# Patient Record
Sex: Female | Born: 1985 | Race: Black or African American | Hispanic: No | Marital: Single | Smoking: Former smoker
Health system: Southern US, Community
[De-identification: ages and names within clinical notes are randomized; demographics above are authoritative.]

## PROBLEM LIST (undated history)

## (undated) ENCOUNTER — Ambulatory Visit (HOSPITAL_COMMUNITY): Admission: EM | Payer: Medicaid - Out of State

---
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
## 2006-06-06 ENCOUNTER — Emergency Department (HOSPITAL_COMMUNITY): Admission: EM | Admit: 2006-06-06 | Discharge: 2006-06-07 | Payer: Self-pay | Admitting: Emergency Medicine

## 2010-11-11 ENCOUNTER — Encounter: Payer: Self-pay | Admitting: Obstetrics & Gynecology

## 2017-03-24 ENCOUNTER — Emergency Department (HOSPITAL_COMMUNITY): Payer: Self-pay

## 2017-03-24 ENCOUNTER — Encounter (HOSPITAL_COMMUNITY): Payer: Self-pay | Admitting: Emergency Medicine

## 2017-03-24 ENCOUNTER — Emergency Department (HOSPITAL_COMMUNITY)
Admission: EM | Admit: 2017-03-24 | Discharge: 2017-03-24 | Disposition: A | Discharge status: Home / Self Care | Payer: Self-pay | Attending: Emergency Medicine | Admitting: Emergency Medicine

## 2017-03-24 DIAGNOSIS — Y999 Unspecified external cause status: Secondary | ICD-10-CM | POA: Insufficient documentation

## 2017-03-24 DIAGNOSIS — Y929 Unspecified place or not applicable: Secondary | ICD-10-CM | POA: Insufficient documentation

## 2017-03-24 DIAGNOSIS — F17210 Nicotine dependence, cigarettes, uncomplicated: Secondary | ICD-10-CM | POA: Insufficient documentation

## 2017-03-24 DIAGNOSIS — Y939 Activity, unspecified: Secondary | ICD-10-CM | POA: Insufficient documentation

## 2017-03-24 DIAGNOSIS — S99921A Unspecified injury of right foot, initial encounter: Secondary | ICD-10-CM | POA: Insufficient documentation

## 2017-03-24 MED ORDER — IBUPROFEN 400 MG PO TABS
600.0000 mg | ORAL_TABLET | Freq: Once | ORAL | Status: AC
  Administered 2017-03-24: 600 mg via ORAL
  Filled 2017-03-24: qty 1

## 2017-03-24 MED ORDER — MELOXICAM 7.5 MG PO TABS: 7.5000 mg | ORAL_TABLET | Freq: Every day | ORAL | 0 refills | Status: DC

## 2017-03-24 NOTE — Discharge Instructions (Signed)
Please read instructions below. Apply ice to your ankle for 20 minutes at a time. Elevate it when possible. You can take meloxicam once a day, with food,  as needed for pain. Schedule an appointment with the orthopedic specialist in 1 week for follow-up on your injury. Return to the ER for new or concerning symptoms.

## 2017-03-24 NOTE — ED Notes (Signed)
Patient transported to X-ray 
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
## 2017-03-24 NOTE — Progress Notes (Signed)
Orthopedic Tech Progress Note Patient Details:  Graylon GunningSequoia J Oshiro [birth year] [id number]  Ortho Devices Type of Ortho Device: Ankle Air splint Ortho Device/Splint Interventions: Application   Saul FordyceJennifer C Reesa Gotschall 03/24/2017, 3:01 PM
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
## 2019-11-08 ENCOUNTER — Other Ambulatory Visit: Payer: Self-pay

## 2019-11-08 ENCOUNTER — Emergency Department (HOSPITAL_COMMUNITY): Payer: Medicaid - Out of State

## 2019-11-08 ENCOUNTER — Emergency Department (HOSPITAL_COMMUNITY)
Admission: EM | Admit: 2019-11-08 | Discharge: 2019-11-08 | Disposition: A | Discharge status: Home / Self Care | Payer: Medicaid - Out of State | Attending: Emergency Medicine | Admitting: Emergency Medicine

## 2019-11-08 ENCOUNTER — Encounter (HOSPITAL_COMMUNITY): Payer: Self-pay | Admitting: Emergency Medicine

## 2019-11-08 DIAGNOSIS — R05 Cough: Secondary | ICD-10-CM | POA: Diagnosis not present

## 2019-11-08 DIAGNOSIS — F17210 Nicotine dependence, cigarettes, uncomplicated: Secondary | ICD-10-CM | POA: Diagnosis not present

## 2019-11-08 DIAGNOSIS — M7918 Myalgia, other site: Secondary | ICD-10-CM | POA: Diagnosis not present

## 2019-11-08 DIAGNOSIS — J029 Acute pharyngitis, unspecified: Secondary | ICD-10-CM | POA: Insufficient documentation

## 2019-11-08 DIAGNOSIS — R509 Fever, unspecified: Secondary | ICD-10-CM | POA: Diagnosis present

## 2019-11-08 DIAGNOSIS — U071 COVID-19: Secondary | ICD-10-CM | POA: Diagnosis not present

## 2019-11-08 DIAGNOSIS — Z20822 Contact with and (suspected) exposure to covid-19: Secondary | ICD-10-CM

## 2019-11-08 MED ORDER — IBUPROFEN 400 MG PO TABS
800.0000 mg | ORAL_TABLET | Freq: Once | ORAL | Status: AC
  Administered 2019-11-08: 04:00:00 800 mg via ORAL
  Filled 2019-11-08: qty 2
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
## 2019-11-08 NOTE — ED Notes (Signed)
Portable xray at bedside.
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
## 2019-11-08 NOTE — Discharge Instructions (Addendum)
Your chest Xray in the ED was reassuring.  Your fever is likely due to a viral illness.  You have a Covid test pending which should result within 48 hours.  You can follow-up on the results of this test through MyChart.  We advise 600mg  ibuprofen every 6 hours for headache and body aches. You may alternate this with 1000mg  Tylenol every 8 hours, if desired, for fever management. Drink plenty of fluids to prevent dehydration. Return for any new or concerning symptoms.

## 2019-11-08 NOTE — ED Notes (Signed)
Called 2 more times  No answer

## 2019-11-08 NOTE — ED Notes (Signed)
ED Provider at bedside. 

## 2019-11-08 NOTE — ED Notes (Signed)
Pt did not answer for triage. 

## 2019-11-09 ENCOUNTER — Ambulatory Visit: Payer: Self-pay

## 2019-11-09 NOTE — Telephone Encounter (Signed)
Provided Patient with Covid -19 test results of Patient  Provide care advice .  Reviewed isolation protocol Patient voiced understanding.  Provided care advice.  Voiced understanding. Will notify HD.

## 2019-11-10 ENCOUNTER — Telehealth: Payer: Self-pay

## 2019-11-10 NOTE — Telephone Encounter (Signed)
Phone call to pt.  Inquired if pt. had returned to [place], [place]., or if she was in [place] since she tested positive for COVID, on 11/08/19?  Pt. reported she was quarantining at relative's home in [place].  Advised will make [place] aware, and to expect a call from a nurse with the Health [facility].  Asked pt. If she had any questions re: her quarantine or home care?  Denied any questions at this time.   Will notify [facility].

## 2019-11-11 LAB — NOVEL CORONAVIRUS, NAA (HOSP ORDER, SEND-OUT TO REF LAB; TAT 18-24 HRS): SARS-CoV-2, NAA: DETECTED — AB

## 2019-11-20 ENCOUNTER — Ambulatory Visit: Payer: Medicaid - Out of State | Attending: Internal Medicine

## 2019-11-20 DIAGNOSIS — Z20822 Contact with and (suspected) exposure to covid-19: Secondary | ICD-10-CM

## 2019-11-21 LAB — NOVEL CORONAVIRUS, NAA: SARS-CoV-2, NAA: NOT DETECTED

## 2020-11-03 ENCOUNTER — Other Ambulatory Visit: Payer: Medicaid - Out of State
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
## 2021-09-06 ENCOUNTER — Emergency Department (HOSPITAL_BASED_OUTPATIENT_CLINIC_OR_DEPARTMENT_OTHER)
Admission: EM | Admit: 2021-09-06 | Discharge: 2021-09-06 | Disposition: A | Discharge status: Home / Self Care | Payer: Medicaid Other | Attending: Emergency Medicine | Admitting: Emergency Medicine

## 2021-09-06 ENCOUNTER — Encounter (HOSPITAL_BASED_OUTPATIENT_CLINIC_OR_DEPARTMENT_OTHER): Payer: Self-pay | Admitting: Emergency Medicine

## 2021-09-06 ENCOUNTER — Other Ambulatory Visit: Payer: Self-pay

## 2021-09-06 DIAGNOSIS — R102 Pelvic and perineal pain: Secondary | ICD-10-CM | POA: Diagnosis present

## 2021-09-06 DIAGNOSIS — N907 Vulvar cyst: Secondary | ICD-10-CM | POA: Diagnosis not present

## 2021-09-06 DIAGNOSIS — F17210 Nicotine dependence, cigarettes, uncomplicated: Secondary | ICD-10-CM | POA: Diagnosis not present

## 2021-09-06 NOTE — ED Provider Notes (Signed)
[facility] EMERGENCY DEPT Provider Note   CSN: [id number] Arrival date & time: 09/06/21  7654     History Chief Complaint  Patient presents with   Vaginal Pain    Sara Key is a 35 y.o. female.  34 year old female presents with mass to left inner labia.  Denies any drainage from the area.  States that it was smaller before and now appears to be larger.  No fever or chills.  Just finished her menstrual cycle currently.  Had some early abdominal cramping associate with her cycle but resolved with Tylenol.      History reviewed. No pertinent past medical history.  There are no problems to display for this patient.   History reviewed. No pertinent surgical history.   OB History     Gravida  1   Para      Term      Preterm      AB      Living         SAB      IAB      Ectopic      Multiple      Live Births              History reviewed. No pertinent family history.  Social History   Tobacco Use   Smoking status: Every Day    Packs/day: 0.50    Types: Cigarettes   Smokeless tobacco: Never  Substance Use Topics   Alcohol use: No    Home Medications Prior to Admission medications   Medication Sig Start Date End Date Taking? Authorizing Provider  levonorgestrel (MIRENA, 52 MG,) 20 MCG/DAY IUD by Intrauterine route. 06/20/19  Yes [provider]  meloxicam (MOBIC) 7.5 MG tablet Take 1 tablet (7.5 mg total) by mouth daily. 03/24/17   Robinson, [place] N, PA-C    Allergies    Patient has no known allergies.  Review of Systems   Review of Systems  All other systems reviewed and are negative.  Physical Exam Updated Vital Signs BP 105/64 (BP Location: Right Arm)   Pulse 81   Temp 97.8 F (36.6 C) (Oral)   Resp 16   Ht 1.549 m (5\' 1" )   Wt 100.7 kg   LMP 08/30/2021 (Exact Date)   SpO2 99%   BMI 41.95 kg/m   Physical Exam Vitals and nursing note reviewed. Exam conducted with a chaperone present.   Constitutional:      General: She is not in acute distress.    Appearance: Normal appearance. She is well-developed. She is not toxic-appearing.  HENT:     Head: Normocephalic and atraumatic.  Eyes:     General: Lids are normal.     Conjunctiva/sclera: Conjunctivae normal.     Pupils: Pupils are equal, round, and reactive to light.  Neck:     Thyroid: No thyroid mass.     Trachea: No tracheal deviation.  Cardiovascular:     Rate and Rhythm: Normal rate and regular rhythm.     Heart sounds: Normal heart sounds. No murmur heard.   No gallop.  Pulmonary:     Effort: Pulmonary effort is normal. No respiratory distress.     Breath sounds: Normal breath sounds. No stridor. No decreased breath sounds, wheezing, rhonchi or rales.  Abdominal:     General: There is no distension.     Palpations: Abdomen is soft.     Tenderness: There is no abdominal tenderness. There is no rebound.  Genitourinary:  Labia:        Left: Lesion present.      Comments: Small pea shaped cyst on the labia minora.  No erythema appreciated.  Well-circumscribed. Musculoskeletal:        General: No tenderness. Normal range of motion.     Cervical back: Normal range of motion and neck supple.  Skin:    General: Skin is warm and dry.     Findings: No abrasion or rash.  Neurological:     Mental Status: She is alert and oriented to person, place, and time. Mental status is at baseline.     GCS: GCS eye subscore is 4. GCS verbal subscore is 5. GCS motor subscore is 6.     Cranial Nerves: No cranial nerve deficit.     Sensory: No sensory deficit.     Motor: Motor function is intact.  Psychiatric:        Attention and Perception: Attention normal.        Speech: Speech normal.        Behavior: Behavior normal.    ED Results / Procedures / Treatments   Labs (all labs ordered are listed, but only abnormal results are displayed) Labs Reviewed - No data to display  EKG None  Radiology No results  found.  Procedures Procedures   Medications Ordered in ED Medications - No data to display  ED Course  I have reviewed the triage vital signs and the nursing notes.  Pertinent labs & imaging results that were available during my care of the patient were reviewed by me and considered in my medical decision making (see chart for details).    MDM Rules/Calculators/A&P                          Patient with labial cyst.  Will give referral to GYN Final Clinical Impression(s) / ED Diagnoses Final diagnoses:  None    Rx / DC Orders ED Discharge Orders     None        Lorre Nick, MD 09/06/21 [phone]

## 2021-09-06 NOTE — ED Notes (Signed)
Patient verbalizes understanding of discharge instructions. Opportunity for questioning and answers were provided. Patient discharged from ED.  °

## 2021-09-06 NOTE — ED Triage Notes (Signed)
Pt arrives to ED with c/o of vaginal pain x1 day. She reports cyst to external labia. She reports that she experienced dyspareunia x2 months ago, then experienced her first menstrual cycle in over two years. She has a mirena IUD and does not experience vaginal bleeding. She reports over the past three days she had pelvic pain that is intermittent. LMP x1 week ago.  No vaginal odors/discharge.

## 2021-09-06 NOTE — Discharge Instructions (Signed)
Call the [facility] from [facility] to schedule follow-up care  [phone]

## 2021-09-07 ENCOUNTER — Ambulatory Visit (INDEPENDENT_AMBULATORY_CARE_PROVIDER_SITE_OTHER): Payer: Medicaid Other | Admitting: Obstetrics and Gynecology

## 2021-09-07 ENCOUNTER — Encounter: Payer: Self-pay | Admitting: Obstetrics and Gynecology

## 2021-09-07 DIAGNOSIS — N907 Vulvar cyst: Secondary | ICD-10-CM

## 2021-09-07 MED ORDER — CEPHALEXIN 500 MG PO CAPS: 500.0000 mg | ORAL_CAPSULE | Freq: Three times a day (TID) | ORAL | 0 refills | Status: DC

## 2021-09-07 NOTE — Progress Notes (Signed)
Reports labial cyst. Started very small. Now larger and thinks part of it drained but part is now "hard". Has new sexual partner August. Reports some vaginal pain following encounter. Reports onset of menses after encounter despite IUD and hx of no menses with IUD.  Last Pap 03/25/19, LGSIL

## 2021-09-07 NOTE — Patient Instructions (Signed)
Epidermoid Cyst  An epidermoid cyst, also called an epidermal cyst, is a small lump under your skin. The cyst contains a substance called keratin. Do not try to pop or openthe cyst yourself. What are the causes? A blocked hair follicle. A hair that curls and re-enters the skin instead of growing straight out of the skin. A blocked pore. Irritated skin. An injury to the skin. Certain conditions that are passed along from parent to child. Human papillomavirus (HPV). This happens rarely when cysts occur on the bottom of the feet. Long-term sun damage to the skin. What increases the risk? Having acne. Being female. Having an injury to the skin. Being past puberty. Having certain conditions caused by genes (genetic disorder) What are the signs or symptoms? These cysts are usually harmless, but they can get infected. Symptoms of infection may include: Redness. Inflammation. Tenderness. Warmth. Fever. A bad-smelling substance that drains from the cyst. Pus that drains from the cyst. How is this treated? In many cases, epidermoid cysts go away on their own without treatment. If a cyst becomes infected, treatment may include: Opening and draining the cyst, done by a doctor. After draining, you may need minor surgery to remove the rest of the cyst. Antibiotic medicine. Shots of medicines (steroids) that help to reduce inflammation. Surgery to remove the cyst. Surgery may be done if the cyst: Becomes large. Bothers you. Has a chance of turning into cancer. Do not try to open a cyst yourself. Follow these instructions at home: Medicines Take over-the-counter and prescription medicines as told by your doctor. If you were prescribed an antibiotic medicine, take it as told by your doctor. Do not stop taking it even if you start to feel better. General instructions Keep the area around your cyst clean and dry. Wear loose, dry clothing. Avoid touching your cyst. Check your cyst every day  for signs of infection. Check for: Redness, swelling, or pain. Fluid or blood. Warmth. Pus or a bad smell. Keep all follow-up visits. How is this prevented? Wear clean, dry, clothing. Avoid wearing tight clothing. Keep your skin clean and dry. Take showers or baths every day. Contact a doctor if: Your cyst has symptoms of infection. Your condition does not improve or gets worse. You have a cyst that looks different from other cysts you have had. You have a fever. Get help right away if: Redness spreads from the cyst into the area close by. Summary An epidermoid cyst is a small lump under your skin. If a cyst becomes infected, treatment may include surgery to open and drain the cyst, or to remove it. Take over-the-counter and prescription medicines only as told by your doctor. Contact a doctor if your condition is not improving or is getting worse. Keep all follow-up visits. This information is not intended to replace advice given to you by your health care provider. Make sure you discuss any questions you have with your healthcare provider. Document Revised: 01/13/2020 Document Reviewed: 01/13/2020 Elsevier Patient Education  2022 [employer]  

## 2021-09-07 NOTE — Progress Notes (Signed)
Ms Sara Key is seen today as work in for evaluation of labial cyst. Pt noted Sx about 1 week ago. Possible spontaous discharge a few days ago. See in [facility] Tuesday and recommended to f/u with GYN IUD for contraception. Sexual active with new partner  PE AF VSS Lungs clear Heart RRR Abd soft + BS GU NL EGBUS, small < 0.5 cm left labial cyst, slightly tender, no discharge expressible, no other lesions noted.  A/P Left Labial cyst  Reviewed with pt. Will Tx with Keflex and warm compresses. F/U in 2-4 weeks for reval and yearly GYN exam

## 2021-10-11 ENCOUNTER — Other Ambulatory Visit (HOSPITAL_COMMUNITY)
Admission: RE | Admit: 2021-10-11 | Discharge: 2021-10-11 | Disposition: A | Discharge status: Home / Self Care | Payer: Medicaid Other | Source: Ambulatory Visit | Attending: Obstetrics and Gynecology | Admitting: Obstetrics and Gynecology

## 2021-10-11 ENCOUNTER — Ambulatory Visit (INDEPENDENT_AMBULATORY_CARE_PROVIDER_SITE_OTHER): Payer: Medicaid Other | Admitting: Obstetrics and Gynecology

## 2021-10-11 ENCOUNTER — Encounter: Payer: Self-pay | Admitting: Obstetrics and Gynecology

## 2021-10-11 ENCOUNTER — Other Ambulatory Visit: Payer: Self-pay

## 2021-10-11 DIAGNOSIS — Z01419 Encounter for gynecological examination (general) (routine) without abnormal findings: Secondary | ICD-10-CM

## 2021-10-11 DIAGNOSIS — Z975 Presence of (intrauterine) contraceptive device: Secondary | ICD-10-CM | POA: Insufficient documentation

## 2021-10-11 DIAGNOSIS — Z202 Contact with and (suspected) exposure to infections with a predominantly sexual mode of transmission: Secondary | ICD-10-CM | POA: Diagnosis present

## 2021-10-11 DIAGNOSIS — Z30431 Encounter for routine checking of intrauterine contraceptive device: Secondary | ICD-10-CM | POA: Diagnosis not present

## 2021-10-11 NOTE — Patient Instructions (Signed)

## 2021-10-11 NOTE — Progress Notes (Signed)
Patient presents for follow up and pap smear. Patient states that her labial cyst has resolved. Patient states that she has been having bleeding the last couple of months since Aug. She also complains of having frequent bv. She request STD testing.

## 2021-10-11 NOTE — Progress Notes (Signed)
Sara Key is a 35 y.o. [phone] female here for a routine annual gynecologic exam.  Current complaints: Irregular bleeding with IUD since August. Mirena IUD placed 3/21. Sexual active without problems. Labial cyst has resolved. Desires STD testing.   Gynecologic History Patient's last menstrual period was 09/27/2021. Contraception: IUD Last Pap: Uncertain. Results were: normal Last mammogram: NA.   Obstetric History OB History  Gravida Para Term Preterm AB Living  3 3 3     3   SAB IAB Ectopic Multiple Live Births          3    # Outcome Date GA Lbr Len/2nd Weight Sex Delivery Anes PTL Lv  3 Term 05/30/19    F CS-LTranv   LIV  2 Term 04/22/08    F Vag-Spont   LIV  1 Term 10/11/04    M Vag-Spont   LIV    History reviewed. No pertinent past medical history.  History reviewed. No pertinent surgical history.  Current Outpatient Medications on File Prior to Visit  Medication Sig Dispense Refill   levonorgestrel (MIRENA, 52 MG,) 20 MCG/DAY IUD by Intrauterine route.     No current facility-administered medications on file prior to visit.    No Known Allergies  Social History   Socioeconomic History   Marital status: Single    Spouse name: Not on file   Number of children: Not on file   Years of education: Not on file   Highest education level: Not on file  Occupational History   Not on file  Tobacco Use   Smoking status: Former    Packs/day: 0.50    Types: Cigarettes    Quit date: 2018    Years since quitting: 4.9   Smokeless tobacco: Never  Vaping Use   Vaping Use: Never used  Substance and Sexual Activity   Alcohol use: Yes    Comment: occ   Drug use: Never   Sexual activity: Yes    Partners: Male    Birth control/protection: I.U.D.  Other Topics Concern   Not on file  Social History Narrative   Not on file   Social Determinants of Health   Financial Resource Strain: Not on file  Food Insecurity: Not on file  Transportation Needs: Not on file   Physical Activity: Not on file  Stress: Not on file  Social Connections: Not on file  Intimate Partner Violence: Not on file    History reviewed. No pertinent family history.  The following portions of the patient's history were reviewed and updated as appropriate: allergies, current medications, past family history, past medical history, past social history, past surgical history and problem list.  Review of Systems Pertinent items noted in HPI and remainder of comprehensive ROS otherwise negative.   Objective:  BP 112/70    Pulse 82    Ht 5\' 1"  (1.549 m)    Wt 231 lb 3.2 oz (104.9 kg)    LMP 09/27/2021    BMI 43.68 kg/m  Chaperone present during exam  CONSTITUTIONAL: Well-developed, well-nourished female in no acute distress.  HENT:  Normocephalic, atraumatic, External right and left ear normal. Oropharynx is clear and moist EYES: Conjunctivae and EOM are normal. Pupils are equal, round, and reactive to light. No scleral icterus.  NECK: Normal range of motion, supple, no masses.  Normal thyroid.  SKIN: Skin is warm and dry. No rash noted. Not diaphoretic. No erythema. No pallor. NEUROLGIC: Alert and oriented to person, place, and time. Normal reflexes, muscle tone  coordination. No cranial nerve deficit noted. PSYCHIATRIC: Normal mood and affect. Normal behavior. Normal judgment and thought content. CARDIOVASCULAR: Normal heart rate noted, regular rhythm RESPIRATORY: Clear to auscultation bilaterally. Effort and breath sounds normal, no problems with respiration noted. BREASTS: Symmetric in size. No masses, skin changes, nipple drainage, or lymphadenopathy. ABDOMEN: Soft, normal bowel sounds, no distention noted.  No tenderness, rebound or guarding.  PELVIC: Normal appearing external genitalia; normal appearing vaginal mucosa and cervix.  No abnormal discharge noted.  Pap smear obtained.  Normal uterine size, no other palpable masses, no uterine or adnexal tenderness. IUD strings not  visualized MUSCULOSKELETAL: Normal range of motion. No tenderness.  No cyanosis, clubbing, or edema.  2+ distal pulses.   Assessment:  Annual gynecologic examination with pap smear STD exposure IUD check up Plan:  Will follow up results of pap smear and manage accordingly. STD testing as per pt request. Will check GYN  U/S. F/U per test results Routine preventative health maintenance measures emphasized. Please refer to After Visit Summary for other counseling recommendations.    Hermina Staggers, MD, FACOG Attending Obstetrician & Gynecologist Center for [facility], [facility]

## 2021-10-12 LAB — CERVICOVAGINAL ANCILLARY ONLY
Bacterial Vaginitis (gardnerella): NEGATIVE
Candida Glabrata: NEGATIVE
Candida Vaginitis: NEGATIVE
Chlamydia: NEGATIVE
Comment: NEGATIVE
Comment: NEGATIVE
Comment: NEGATIVE
Comment: NEGATIVE
Comment: NEGATIVE
Comment: NORMAL
Neisseria Gonorrhea: NEGATIVE
Trichomonas: NEGATIVE

## 2021-10-12 LAB — HEPATITIS C ANTIBODY: Hep C Virus Ab: <0.1 s/co ratio (ref 0.0–0.9)

## 2021-10-12 LAB — RPR: RPR Ser Ql: NONREACTIVE

## 2021-10-12 LAB — HEPATITIS B SURFACE ANTIGEN: Hepatitis B Surface Ag: NEGATIVE

## 2021-10-12 LAB — HIV ANTIBODY (ROUTINE TESTING W REFLEX): HIV Screen 4th Generation wRfx: NONREACTIVE

## 2021-10-18 ENCOUNTER — Ambulatory Visit (HOSPITAL_COMMUNITY)
Admission: RE | Admit: 2021-10-18 | Discharge: 2021-10-18 | Disposition: A | Discharge status: Home / Self Care | Payer: Medicaid Other | Source: Ambulatory Visit | Attending: Obstetrics and Gynecology | Admitting: Obstetrics and Gynecology

## 2021-10-18 ENCOUNTER — Other Ambulatory Visit: Payer: Self-pay

## 2021-10-18 DIAGNOSIS — Z30431 Encounter for routine checking of intrauterine contraceptive device: Secondary | ICD-10-CM | POA: Diagnosis present

## 2021-10-20 LAB — CYTOLOGY - PAP
Comment: NEGATIVE
Comment: NEGATIVE
Diagnosis: NEGATIVE
HPV 16: NEGATIVE
HPV 18 / 45: NEGATIVE
High risk HPV: POSITIVE — AB

## 2021-10-24 ENCOUNTER — Other Ambulatory Visit: Payer: Self-pay

## 2021-10-24 DIAGNOSIS — N83202 Unspecified ovarian cyst, left side: Secondary | ICD-10-CM

## 2021-10-24 DIAGNOSIS — N83201 Unspecified ovarian cyst, right side: Secondary | ICD-10-CM

## 2021-11-10 ENCOUNTER — Encounter: Payer: Self-pay | Admitting: Obstetrics and Gynecology

## 2021-11-10 ENCOUNTER — Telehealth (INDEPENDENT_AMBULATORY_CARE_PROVIDER_SITE_OTHER): Payer: Medicaid Other | Admitting: Obstetrics and Gynecology

## 2021-11-10 DIAGNOSIS — N83209 Unspecified ovarian cyst, unspecified side: Secondary | ICD-10-CM | POA: Diagnosis not present

## 2021-11-10 DIAGNOSIS — Z975 Presence of (intrauterine) contraceptive device: Secondary | ICD-10-CM

## 2021-11-10 NOTE — Progress Notes (Signed)
TELEHEALTH GYNECOLOGY VISIT ENCOUNTER NOTE  Provider location: Center for [facility]   Patient location: Home  I connected with Sara Key on 11/10/21 at  9:35 AM EST by telephone and verified that I am speaking with the correct person using two identifiers. Patient was unable to do MyChart audiovisual encounter due to technical difficulties, she tried several times.    I discussed the limitations, risks, security and privacy concerns of performing an evaluation and management service by telephone and the availability of in person appointments. I also discussed with the patient that there may be a patient responsible charge related to this service. The patient expressed understanding and agreed to proceed.   History:  Sara Key is a 36 y.o. [phone] female being evaluated today for GYN U/S. GYN U/S results reviewed with pt    History reviewed. No pertinent past medical history. History reviewed. No pertinent surgical history. The following portions of the patient's history were reviewed and updated as appropriate: allergies, current medications, past family history, past medical history, past social history, past surgical history and problem list.    Review of Systems:  Pertinent items noted in HPI and remainder of comprehensive ROS otherwise negative.  Physical Exam:   General:  Alert, oriented and cooperative.   Mental Status: Normal mood and affect perceived. Normal judgment and thought content.  Physical exam deferred due to nature of the encounter  Labs and Imaging No results found for this or any previous visit (from the past 336 hour(s)). US PELVIC COMPLETE WITH TRANSVAGINAL  Result Date: 10/19/2021 CLINICAL DATA:  Lost IUD string. EXAM: TRANSABDOMINAL AND TRANSVAGINAL ULTRASOUND OF PELVIS TECHNIQUE: Both transabdominal and transvaginal ultrasound examinations of the pelvis were performed. Transabdominal technique was performed for global  imaging of the pelvis including uterus, ovaries, adnexal regions, and pelvic cul-de-sac. It was necessary to proceed with endovaginal exam following the transabdominal exam to visualize the uterus, endometrium, bilateral ovaries and bilateral adnexa. COMPARISON:  None FINDINGS: Uterus Measurements: 8.7 cm x 4.3 cm x 4.5 cm = volume: 86.8 mL. No fibroids or other mass visualized. Multiple small nabothian cysts are noted. Endometrium Thickness: 9.0 mm.  An IUD is seen within the lower uterine segment. Right ovary Measurements: 3.5 cm x 2.6 cm x 2.8 cm = volume: 13.3 mL. A 2.7 cm x 2.0 cm x 2.4 cm complex hypoechoic area is seen within the right ovary. No abnormal flow is noted within this region on color Doppler evaluation. Left ovary Measurements: 3.2 cm x 2.1 cm x 2.5 cm = volume: 8.8 mL. Or a 2.1 cm x 1.4 cm x 1.9 cm predominately anechoic structure is seen within the left ovary. No flow is seen within this region on color Doppler evaluation. Other findings There is a small amount of pelvic free fluid. IMPRESSION: 1. IUD located within the lower uterine segment. 2. Complex right ovarian and simple left ovarian cysts, as described above. Correlation with 6 week to 12 week follow-up pelvic ultrasound is recommended to ensure resolution. This recommendation follows the consensus statement: Management of Asymptomatic Ovarian and Other Adnexal Cysts Imaged at [place]: Society of Radiologists in Ultrasound Consensus Conference Statement. Radiology 2010; [id number]. Electronically Signed   By: Aram Candela M.D.   On: 10/19/2021 04:18      Assessment and Plan:     1. Cyst of ovary, unspecified laterality   2. IUD contraception     F/U GYN U/S scheduled. IUD location reviewed with pt. Pt desires to  have removed. Will schedule   I discussed the assessment and treatment plan with the patient. The patient was provided an opportunity to ask questions and all were answered. The patient agreed with the plan and  demonstrated an understanding of the instructions.   The patient was advised to call back or seek an in-person evaluation/go to the ED if the symptoms worsen or if the condition fails to improve as anticipated.  I provided 8 minutes of non-face-to-face time during this encounter.   Hermina Staggers, MD [facility] for [facility], [facility]

## 2021-11-29 ENCOUNTER — Ambulatory Visit: Payer: Medicaid Other | Admitting: Obstetrics and Gynecology

## 2021-12-04 ENCOUNTER — Ambulatory Visit: Payer: Medicaid Other | Admitting: Obstetrics and Gynecology

## 2021-12-13 ENCOUNTER — Ambulatory Visit: Admission: RE | Admit: 2021-12-13 | Payer: Medicaid Other | Source: Ambulatory Visit
